# Patient Record
Sex: Male | Born: 2000 | Hispanic: Yes | Marital: Single | Smoking: Never smoker
Health system: Southern US, Community
[De-identification: ages and names within clinical notes are randomized; demographics above are authoritative.]

---
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
## 2020-08-17 ENCOUNTER — Other Ambulatory Visit: Payer: Self-pay

## 2020-08-17 ENCOUNTER — Encounter: Payer: Self-pay | Admitting: Emergency Medicine

## 2020-08-17 ENCOUNTER — Emergency Department
Admission: EM | Admit: 2020-08-17 | Discharge: 2020-08-17 | Disposition: A | Discharge status: Home / Self Care | Payer: Medicaid Other | Attending: Emergency Medicine | Admitting: Emergency Medicine

## 2020-08-17 DIAGNOSIS — S0185XA Open bite of other part of head, initial encounter: Secondary | ICD-10-CM | POA: Insufficient documentation

## 2020-08-17 DIAGNOSIS — W540XXA Bitten by dog, initial encounter: Secondary | ICD-10-CM | POA: Insufficient documentation

## 2020-08-17 DIAGNOSIS — Z23 Encounter for immunization: Secondary | ICD-10-CM | POA: Insufficient documentation

## 2020-08-17 MED ORDER — BACITRACIN-NEOMYCIN-POLYMYXIN 400-5-5000 EX OINT
TOPICAL_OINTMENT | Freq: Once | CUTANEOUS | Status: AC
  Administered 2020-08-17: 1 via TOPICAL
  Filled 2020-08-17: qty 1

## 2020-08-17 MED ORDER — TETANUS-DIPHTH-ACELL PERTUSSIS 5-2.5-18.5 LF-MCG/0.5 IM SUSY
0.5000 mL | PREFILLED_SYRINGE | Freq: Once | INTRAMUSCULAR | Status: AC
  Administered 2020-08-17: 0.5 mL via INTRAMUSCULAR
  Filled 2020-08-17: qty 0.5
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
## 2020-08-17 NOTE — ED Triage Notes (Signed)
Patient to ER for c/o dog bite by her personal dog to left lower face. Patient states dog is up to date on rabies, had last rabies shot a few months ago.

## 2020-08-17 NOTE — ED Provider Notes (Signed)
[facility] Emergency Department Provider Note   ____________________________________________   First MD Initiated Contact with Patient 08/17/20 0235     (approximate)  I have reviewed the triage vital signs and the nursing notes.   HISTORY  Chief Complaint Animal Bite    HPI Jerry Dunlap is a 19 y.o. male with no stated past medical history who presents after his small dog bit him in the left side of the face.  Patient states that the dog was asleep when he attempted to wake him up in the dark and the dog was startled and try to bite the left aspect of his face.  Patient describes a minimal amount of bleeding as well as superficial lacerations to the left side of his jaw and left aspect of the mouth.  Patient only describes mild aching 2/10 pain that does not radiate and has been stable since onset.  Patient states Dennie Bible is up-to-date on all vaccinations including rabies         History reviewed. No pertinent past medical history.  There are no problems to display for this patient.   History reviewed. No pertinent surgical history.  Prior to Admission medications   Medication Sig Start Date End Date Taking? Authorizing Provider  polyethylene glycol (MIRALAX / GLYCOLAX) packet Take 17 g by mouth daily. Mix one tablespoon with 8oz of your favorite juice or water every day until you are having soft formed stools. Then start taking once daily if you didn't have a stool the day before. 06/17/17   Willy Eddy, MD    Allergies Patient has no known allergies.  No family history on file.  Social History Social History   Tobacco Use  . Smoking status: Never Smoker  . Smokeless tobacco: Never Used  Substance Use Topics  . Alcohol use: No  . Drug use: No    Review of Systems Constitutional: No fever/chills Eyes: No visual changes. ENT: No sore throat. Cardiovascular: Denies chest pain. Respiratory: Denies shortness of  breath. Gastrointestinal: No abdominal pain.  No nausea, no vomiting.  No diarrhea. Genitourinary: Negative for dysuria. Musculoskeletal: Negative for acute arthralgias Skin: Endorses superficial lacerations to the left aspect of the face Neurological: Negative for headaches, weakness/numbness/paresthesias in any extremity Psychiatric: Negative for suicidal ideation/homicidal ideation   ____________________________________________   PHYSICAL EXAM:  VITAL SIGNS: ED Triage Vitals  Enc Vitals Group     BP 08/17/20 0008 130/76     Pulse Rate 08/17/20 0008 69     Resp 08/17/20 0008 20     Temp 08/17/20 0008 98.9 F (37.2 C)     Temp Source 08/17/20 0008 Oral     SpO2 08/17/20 0008 100 %     Weight 08/17/20 0010 145 lb (65.8 kg)     Height 08/17/20 0010 5\' 11"  (1.803 m)     Head Circumference --      Peak Flow --      Pain Score 08/17/20 0009 5     Pain Loc --      Pain Edu? --      Excl. in GC? --    Constitutional: Alert and oriented. Well appearing and in no acute distress. Eyes: Conjunctivae are normal. PERRL. Head: Atraumatic. Nose: No congestion/rhinnorhea. Mouth/Throat: Mucous membranes are moist. Neck: No stridor Cardiovascular: Grossly normal heart sounds.  Good peripheral circulation. Respiratory: Normal respiratory effort.  No retractions. Gastrointestinal: Soft and nontender. No distention. Musculoskeletal: No obvious deformities Neurologic:  Normal speech and language. No  gross focal neurologic deficits are appreciated. Skin:  Skin is warm and dry.  Scattered superficial laceration to the left angle of the mandible as well as to the left perioral area Psychiatric: Mood and affect are normal. Speech and behavior are normal.  ____________________________________________   LABS (all labs ordered are listed, but only abnormal results are displayed)  Labs Reviewed - No data to display   PROCEDURES  Procedure(s) performed (including Critical  Care):  Procedures   ____________________________________________   INITIAL IMPRESSION / ASSESSMENT AND PLAN / ED COURSE  As part of my medical decision making, I reviewed the following data within the electronic MEDICAL RECORD NUMBER Nursing notes reviewed and incorporated, Labs reviewed, Old chart reviewed, and Notes from prior ED visits reviewed and incorporated        The patient suffered a bite wound from a known dog, but based on the history, exam, and any test performed, there does not seem to be a retained foreign body, nerve injury, vascular injury, tendon injury, or bone injury.  Disposition: Patient will be discharged with strict return precautions and advice to follow up with primary MD within 24 hours for further evaluation.      ____________________________________________   FINAL CLINICAL IMPRESSION(S) / ED DIAGNOSES  Final diagnoses:  Dog bite of face, initial encounter     ED Discharge Orders    None       Note:  This document was prepared using Dragon voice recognition software and may include unintentional dictation errors.   Merwyn Katos, MD 08/17/20 [phone]

## 2021-06-11 ENCOUNTER — Telehealth: Payer: Self-pay | Admitting: Licensed Clinical Social Worker

## 2021-06-11 NOTE — Telephone Encounter (Signed)
Pt. Left vm requesting to schedule an appt.

## 2023-11-28 ENCOUNTER — Encounter: Payer: Self-pay | Admitting: Emergency Medicine

## 2023-11-28 ENCOUNTER — Emergency Department
Admission: EM | Admit: 2023-11-28 | Discharge: 2023-11-28 | Disposition: A | Discharge status: Home / Self Care | Attending: Emergency Medicine | Admitting: Emergency Medicine

## 2023-11-28 ENCOUNTER — Other Ambulatory Visit: Payer: Self-pay

## 2023-11-28 DIAGNOSIS — R21 Rash and other nonspecific skin eruption: Secondary | ICD-10-CM | POA: Insufficient documentation

## 2023-11-28 DIAGNOSIS — B001 Herpesviral vesicular dermatitis: Secondary | ICD-10-CM

## 2023-11-28 MED ORDER — ACYCLOVIR 5 % EX OINT: TOPICAL_OINTMENT | CUTANEOUS | 0 refills | Status: AC

## 2023-11-28 NOTE — ED Provider Notes (Signed)
 [facility] Provider Note    Event Date/Time   First MD Initiated Contact with Patient 11/28/23 1949     (approximate)   History   Rash   HPI  Jerry Dunlap is a 23 y.o. male who presents to ED with history of blisters in the corner of his mouth bilateral.  States is a burning sensation,  first time having this kind of episode.  Patient denies fever, flulike symptoms.     Physical Exam   Triage Vital Signs: ED Triage Vitals  Encounter Vitals Group     BP 11/28/23 1730 114/69     Systolic BP Percentile --      Diastolic BP Percentile --      Pulse Rate 11/28/23 1730 (!) 113     Resp 11/28/23 1730 18     Temp 11/28/23 1730 98.8 F (37.1 C)     Temp Source 11/28/23 1730 Oral     SpO2 11/28/23 1730 100 %     Weight 11/28/23 1729 130 lb (59 kg)     Height 11/28/23 1729 5\' 11"  (1.803 m)     Head Circumference --      Peak Flow --      Pain Score 11/28/23 1729 0     Pain Loc --      Pain Education --      Exclude from Growth Chart --     Most recent vital signs: Vitals:   11/28/23 1730  BP: 114/69  Pulse: (!) 113  Resp: 18  Temp: 98.8 F (37.1 C)  SpO2: 100%     Constitutional: Alert, NAD. Able to speak in complete sentences without cough or dyspnea  Eyes: Conjunctivae are normal.  Head: Atraumatic. Nose: No congestion/rhinnorhea. Mouth/Throat: Lips : Right lateral commissure presence of blister.  Left lateral commissure mucosa presence of 2 small blisters.  mucous membranes are moist.   Neck: Painless ROM. Supple. No JVD, nodes, thyromegaly  Cardiovascular:   Good peripheral circulation.RRR no murmurs, gallops, rubs  Respiratory: Normal respiratory effort.  No retractions. Clear to auscultation bilaterally without wheezing or crackles  Gastrointestinal: Soft and nontender.  Musculoskeletal:  no deformity Neurologic:  MAE spontaneously. No gross focal neurologic deficits are appreciated.  Skin:  Skin is warm, dry and  intact. No rash noted. Psychiatric: Mood and affect are normal. Speech and behavior are normal.    ED Results / Procedures / Treatments   Labs (all labs ordered are listed, but only abnormal results are displayed) Labs Reviewed - No data to display     PROCEDURES:  Critical Care performed:   Procedures   MEDICATIONS ORDERED IN ED: Medications - No data to display    IMPRESSION / MDM / ASSESSMENT AND PLAN / ED COURSE  I reviewed the triage vital signs and the nursing notes.  Differential diagnosis includes, but is not limited to, herpes labialis, acne, soft tissue injury  Patient's presentation is most consistent with acute, uncomplicated illness.  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  I did review the patient's allergies and medications.The patient is in stable and satisfactory condition for discharge home  Patient will be discharged home with prescriptions for Aciclovir ointment.  Patient is to follow up with PCP as needed or otherwise directed. Patient is given ED precautions to return to the ED for any worsening or new symptoms. Discussed plan of care with patient, answered all of patient's questions, Patient agreeable to plan of care. Advised patient to take  medications according to the instructions on the label. Discussed possible side effects of new medications. Patient verbalized understanding.    FINAL CLINICAL IMPRESSION(S) / ED DIAGNOSES   Final diagnoses:  None     Rx / DC Orders   ED Discharge Orders     None        Note:  This document was prepared using Dragon voice recognition software and may include unintentional dictation errors.   Gladys Damme, PA-C 11/28/23 2013    Concha Se, MD 11/28/23 2325

## 2023-11-28 NOTE — ED Triage Notes (Signed)
 Patient to ED via POV for "bumps" on the inside corner of his mouth. First noticed it today. States burning sensation.

## 2023-11-28 NOTE — Discharge Instructions (Addendum)
 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  Please apply thin layer of the ointment to  the affected areas.  2 times per day.  This come back to ED or go to your PCP if you have new symptoms or symptoms worsen

## 2024-01-30 ENCOUNTER — Other Ambulatory Visit: Payer: Self-pay

## 2024-01-30 ENCOUNTER — Emergency Department
Admission: EM | Admit: 2024-01-30 | Discharge: 2024-01-30 | Disposition: A | Discharge status: Home / Self Care | Attending: Emergency Medicine | Admitting: Emergency Medicine

## 2024-01-30 DIAGNOSIS — W540XXA Bitten by dog, initial encounter: Secondary | ICD-10-CM | POA: Insufficient documentation

## 2024-01-30 DIAGNOSIS — Z23 Encounter for immunization: Secondary | ICD-10-CM | POA: Diagnosis not present

## 2024-01-30 DIAGNOSIS — S0992XA Unspecified injury of nose, initial encounter: Secondary | ICD-10-CM | POA: Diagnosis present

## 2024-01-30 DIAGNOSIS — S0031XA Abrasion of nose, initial encounter: Secondary | ICD-10-CM | POA: Diagnosis not present

## 2024-01-30 MED ORDER — AMOXICILLIN-POT CLAVULANATE 875-125 MG PO TABS
1.0000 | ORAL_TABLET | Freq: Once | ORAL | Status: AC
  Administered 2024-01-30: 1 via ORAL
  Filled 2024-01-30: qty 1

## 2024-01-30 MED ORDER — TETANUS-DIPHTH-ACELL PERTUSSIS 5-2.5-18.5 LF-MCG/0.5 IM SUSY
0.5000 mL | PREFILLED_SYRINGE | Freq: Once | INTRAMUSCULAR | Status: AC
  Administered 2024-01-30: 0.5 mL via INTRAMUSCULAR
  Filled 2024-01-30: qty 0.5

## 2024-01-30 MED ORDER — AMOXICILLIN-POT CLAVULANATE 875-125 MG PO TABS: 1.0000 | ORAL_TABLET | Freq: Two times a day (BID) | ORAL | 0 refills | Status: AC

## 2024-01-30 NOTE — Discharge Instructions (Addendum)
 Please make sure to take your antibiotics as prescribed for the dog bite.

## 2024-01-30 NOTE — ED Provider Notes (Signed)
 [facility] Provider Note    Event Date/Time   First MD Initiated Contact with Patient 01/30/24 1429     (approximate)   History   Animal Bite (/)   HPI  Jerry Dunlap is a 23 y.o. male here for dog bite to face.  States that he was playing with the dog, grabbed the dog's face, dog bit him on the nose and inside his lip.  Dog belongs to patient, is up-to-date vaccinations.  Denies bites anywhere else.  Unknown if up-to-date with his Tdap.     Physical Exam   Triage Vital Signs: ED Triage Vitals  Encounter Vitals Group     BP 01/30/24 1407 120/82     Systolic BP Percentile --      Diastolic BP Percentile --      Pulse Rate 01/30/24 1407 91     Resp 01/30/24 1407 20     Temp 01/30/24 1407 98.5 F (36.9 C)     Temp Source 01/30/24 1407 Oral     SpO2 01/30/24 1407 98 %     Weight --      Height --      Head Circumference --      Peak Flow --      Pain Score 01/30/24 1408 0     Pain Loc --      Pain Education --      Exclude from Growth Chart --     Most recent vital signs: Vitals:   01/30/24 1407  BP: 120/82  Pulse: 91  Resp: 20  Temp: 98.5 F (36.9 C)  SpO2: 98%     General: Awake, no distress.  CV:  Good peripheral perfusion.  Resp:  Normal effort.  Abd:  No distention.  Other:  Superficial abrasions to anterior nose, he does not have any open lacerations to his lips or intraoral region, he does have a very small bruise to the top inner lip.   ED Results / Procedures / Treatments   Labs (all labs ordered are listed, but only abnormal results are displayed) Labs Reviewed - No data to display    PROCEDURES:  Critical Care performed: No  Procedures   MEDICATIONS ORDERED IN ED: Medications  Tdap (BOOSTRIX ) injection 0.5 mL (has no administration in time range)  amoxicillin-clavulanate (AUGMENTIN) 875-125 MG per tablet 1 tablet (has no administration in time range)     IMPRESSION / MDM / ASSESSMENT AND  PLAN / ED COURSE  I reviewed the triage vital signs and the nursing notes.                              Differential diagnosis includes, but is not limited to, abrasion, dog bite, no open lacerations for repair.  Given Tdap, first dose of Augmentin.  Will also place a referral for primary care.  Otherwise considered but no indication for inpatient admission at this time, he is safe for outpatient management.  Shared decision making by patient and he is agreeable plan for discharge.  Discharge with strict return precautions.  Social determinants of health, lack of primary care.  Patient's presentation is most consistent with acute presentation with potential threat to life or bodily function.       FINAL CLINICAL IMPRESSION(S) / ED DIAGNOSES   Final diagnoses:  Dog bite, initial encounter  Abrasion of nose, initial encounter     Rx / DC Orders  ED Discharge Orders          Ordered    Ambulatory Referral to Primary Care (Establish Care)        01/30/24 1437    amoxicillin-clavulanate (AUGMENTIN) 875-125 MG tablet  2 times daily        01/30/24 1437             Note:  This document was prepared using Dragon voice recognition software and may include unintentional dictation errors.    Shane Darling, MD 01/30/24 [phone]

## 2024-01-30 NOTE — ED Triage Notes (Signed)
 Pt to ED via POV from home. Pt reports was bit by his dog. Pt bit on the bridge of nose and feels like he was bit in the mouth as well. Dog is up to date on shots.

## 2024-02-20 ENCOUNTER — Other Ambulatory Visit: Payer: Self-pay

## 2024-02-20 ENCOUNTER — Emergency Department
Admission: EM | Admit: 2024-02-20 | Discharge: 2024-02-20 | Disposition: A | Discharge status: Home / Self Care | Attending: Emergency Medicine | Admitting: Emergency Medicine

## 2024-02-20 DIAGNOSIS — K130 Diseases of lips: Secondary | ICD-10-CM | POA: Diagnosis present

## 2024-02-20 NOTE — Discharge Instructions (Addendum)
 Please keep the corners of your mouth moisturized with a Vaseline or Chapstick.  Please make sure you are staying hydrated throughout the day please.  Please refer to the hydration instructions.  Please take 1 multivitamin per day

## 2024-02-20 NOTE — ED Provider Notes (Signed)
 [facility] Provider Note   Event Date/Time   First MD Initiated Contact with Patient 02/20/24 1237     (approximate) History  Mouth Lesions  HPI Jerry Dunlap is a 23 y.o. male with no stated past medical history who presents for the second time in 1 month for lesions to the corners of his mouth on both sides.  Patient states that he was seen here recently and prescribed acyclovir  cream for these angular lesions however they have not improved.  Patient states that they have been present for the last 2 months and have not improved since he noticed them.  Patient denies any exacerbating or relieving factors.  Patient does endorse dehydration intermittently due to low p.o. fluid intake ROS: Patient currently denies any vision changes, tinnitus, difficulty speaking, facial droop, sore throat, chest pain, shortness of breath, abdominal pain, nausea/vomiting/diarrhea, dysuria, or weakness/numbness/paresthesias in any extremity   Physical Exam  Triage Vital Signs: ED Triage Vitals  Encounter Vitals Group     BP 02/20/24 1147 116/82     Systolic BP Percentile --      Diastolic BP Percentile --      Pulse Rate 02/20/24 1147 100     Resp 02/20/24 1147 20     Temp 02/20/24 1147 98.4 F (36.9 C)     Temp src --      SpO2 02/20/24 1147 97 %     Weight --      Height --      Head Circumference --      Peak Flow --      Pain Score 02/20/24 1152 0     Pain Loc --      Pain Education --      Exclude from Growth Chart --    Most recent vital signs: Vitals:   02/20/24 1147  BP: 116/82  Pulse: 100  Resp: 20  Temp: 98.4 F (36.9 C)  SpO2: 97%   General: Awake, oriented x4. CV:  Good peripheral perfusion. Resp:  Normal effort. Abd:  No distention. Other:  Young adult well-developed, well-nourished Hispanic male resting comfortably in no acute distress.  Erythema with small papules at bilateral oral angles ED Results / Procedures / Treatments   Labs (all labs ordered are listed, but only abnormal results are displayed) Labs Reviewed - No data to display  PROCEDURES: Critical Care performed: No Procedures MEDICATIONS ORDERED IN ED: Medications - No data to display IMPRESSION / MDM / ASSESSMENT AND PLAN / ED COURSE  I reviewed the triage vital signs and the nursing notes.                             The patient is on the cardiac monitor to evaluate for evidence of arrhythmia and/or significant heart rate changes. Patient's presentation is most consistent with acute presentation with potential threat to life or bodily function. Patient is non-toxic appearing and well hydrated. Ddx: Patient's symptoms not typical for other emergent causes of rash such as cellulitis, abscess, necrotizing fasciitis, vasculitis, anaphylaxis, SJS or TENS. Patient has signs and symptoms of angular cheilitis.  Patient was encouraged to stay hydrated as well as add multivitamin to his diet as patient states that it is not uncommon for him to eat 1 meal for an entire day.  Patient also encouraged to follow-up with his primary care physician if this regimen does not improve his symptoms over the next 2 weeks  Disposition: Patient will be discharged with strict return precautions and follow up with pediatrician within 24-48 hours for further evaluation.   FINAL CLINICAL IMPRESSION(S) / ED DIAGNOSES   Final diagnoses:  Angular cheilitis   Rx / DC Orders   ED Discharge Orders     None      Note:  This document was prepared using Dragon voice recognition software and may include unintentional dictation errors.   Promise Bushong K, MD 02/20/24 [phone]

## 2024-02-20 NOTE — ED Notes (Signed)
 No open sores visible on lips or inside mouth. One flesh-colored bump noted at each corner of mouth. Patient denies pain.

## 2024-02-20 NOTE — ED Triage Notes (Signed)
 Pt to Ed via POV from home. Pt reports was seen here and given cream for ulcers in his mouth. Pt reports cream is not helping. Pt denies pain.

## 2024-06-21 ENCOUNTER — Emergency Department
Admission: EM | Admit: 2024-06-21 | Discharge: 2024-06-21 | Disposition: A | Discharge status: Home / Self Care | Attending: Emergency Medicine | Admitting: Emergency Medicine

## 2024-06-21 ENCOUNTER — Other Ambulatory Visit: Payer: Self-pay

## 2024-06-21 DIAGNOSIS — K130 Diseases of lips: Secondary | ICD-10-CM | POA: Insufficient documentation

## 2024-06-21 DIAGNOSIS — R21 Rash and other nonspecific skin eruption: Secondary | ICD-10-CM | POA: Diagnosis present

## 2024-06-21 MED ORDER — MUPIROCIN 2 % EX OINT: 1.0000 | TOPICAL_OINTMENT | Freq: Two times a day (BID) | CUTANEOUS | 0 refills | Status: AC

## 2024-06-21 NOTE — Discharge Instructions (Signed)
 Please stop applying the hyaluronic acid and the retinol to these areas.  If your symptoms persist, then you may use the ointment that has been prescribed.  Please return for any new, worsening, or changing symptoms or other concerns.

## 2024-06-21 NOTE — ED Triage Notes (Signed)
 Pt comes with mouth ulcers. Pt was seen here recently and evaluated. Pt states it hasn't gone away. Pt states some bumps on his lips now too.

## 2024-06-21 NOTE — ED Provider Notes (Signed)
 [facility] Provider Note    Event Date/Time   First MD Initiated Contact with Patient 06/21/24 1127     (approximate)   History   Mouth Lesions   HPI  Jerry Dunlap is a 23 y.o. male who presents to the ER for evaluation of soreness and rash noted to the corners of his mouth.  He reports that this has been there for the past several months.  He reports that he was initially told that he had herpes, the patient did not believe this.  He did not notice any improvement with the acyclovir .  He reports that he has been using hyaluronic acid and retinol on his face including this area.  No fevers or chills.  No intraoral lesions.  Patient denies any recent sexual activity, has not noticed a rash elsewhere, and has not kissed anybody in several months.  There are no active problems to display for this patient.         Physical Exam   Triage Vital Signs: ED Triage Vitals [06/21/24 1047]  Encounter Vitals Group     BP (!) 133/94     Girls Systolic BP Percentile      Girls Diastolic BP Percentile      Boys Systolic BP Percentile      Boys Diastolic BP Percentile      Pulse Rate 89     Resp 18     Temp 98 F (36.7 C)     Temp src      SpO2 99 %     Weight      Height      Head Circumference      Peak Flow      Pain Score 5     Pain Loc      Pain Education      Exclude from Growth Chart     Most recent vital signs: Vitals:   06/21/24 1047  BP: (!) 133/94  Pulse: 89  Resp: 18  Temp: 98 F (36.7 C)  SpO2: 99%    Physical Exam Vitals and nursing note reviewed.  Constitutional:      General: Awake and alert. No acute distress.    Appearance: Normal appearance. The patient is normal weight.  HENT:     Head: Normocephalic and atraumatic.     Mouth: Mucous membranes are moist.  No appreciable rash.  Mild irritation noted at the corners of his mouth bilaterally.  No vesicular lesions noted intraorally or to his lips. Eyes:      General: PERRL. Normal EOMs        Right eye: No discharge.        Left eye: No discharge.     Conjunctiva/sclera: Conjunctivae normal.  Cardiovascular:     Rate and Rhythm: Normal rate and regular rhythm.     Pulses: Normal pulses.  Pulmonary:     Effort: Pulmonary effort is normal. No respiratory distress.     Breath sounds: Normal breath sounds.  Abdominal:     Abdomen is soft. There is no abdominal tenderness. No rebound or guarding. No distention. Musculoskeletal:        General: No swelling. Normal range of motion.     Cervical back: Normal range of motion and neck supple.  Skin:    General: Skin is warm and dry.     Capillary Refill: Capillary refill takes less than 2 seconds.     Findings: No rash elsewhere to body Neurological:  Mental Status: The patient is awake and alert.      ED Results / Procedures / Treatments   Labs (all labs ordered are listed, but only abnormal results are displayed) Labs Reviewed - No data to display   EKG     RADIOLOGY     PROCEDURES:  Critical Care performed:   Procedures   MEDICATIONS ORDERED IN ED: Medications - No data to display   IMPRESSION / MDM / ASSESSMENT AND PLAN / ED COURSE  I reviewed the triage vital signs and the nursing notes.   Differential diagnosis includes, but is not limited to, angular cheilitis, contact dermatitis, less likely HSV.  I reviewed the patient's chart.  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  He was also seen on 02/20/2024 and diagnosed with angular cheilitis.  No new medications or intraoral lesions or rash elsewhere to suggest SJS or TENS.  There are no vesicular lesions to suggest HSV.  His symptoms appear to be consistent with angular cheilitis.  He has been using hyaluronic acid and retinol on these locations, I advised that he stop using these as they can be irritating to the skin.  If his symptoms continue after 1-2 cessation of these products he was advised that  he may trial the topical mupirocin.  We discussed outpatient follow-up with his PCP.  We discussed return precautions.  Patient understands and agrees with plan.  Discharged in stable condition.  Patient's presentation is most consistent with acute illness / injury with system symptoms.  FINAL CLINICAL IMPRESSION(S) / ED DIAGNOSES   Final diagnoses:  Angular cheilitis     Rx / DC Orders   ED Discharge Orders          Ordered    mupirocin ointment (BACTROBAN) 2 %  2 times daily        06/21/24 1145             Note:  This document was prepared using Dragon voice recognition software and may include unintentional dictation errors.   Johnwilliam Shepperson E, PA-C 06/21/24 1439    Suzanne Kirsch, MD 06/21/24 1601
